# Patient Record
Sex: Male | Born: 1950 | Race: White | Hispanic: No | Marital: Married | Smoking: Never smoker
Health system: Southern US, Community
[De-identification: ages and names within clinical notes are randomized; demographics above are authoritative.]

## PROBLEM LIST (undated history)

## (undated) DIAGNOSIS — F32A Depression, unspecified: Secondary | ICD-10-CM

## (undated) DIAGNOSIS — F419 Anxiety disorder, unspecified: Secondary | ICD-10-CM

## (undated) DIAGNOSIS — E119 Type 2 diabetes mellitus without complications: Secondary | ICD-10-CM

## (undated) DIAGNOSIS — I10 Essential (primary) hypertension: Secondary | ICD-10-CM

## (undated) DIAGNOSIS — R911 Solitary pulmonary nodule: Secondary | ICD-10-CM

## (undated) DIAGNOSIS — G4730 Sleep apnea, unspecified: Secondary | ICD-10-CM

## (undated) DIAGNOSIS — E785 Hyperlipidemia, unspecified: Secondary | ICD-10-CM

---

## 2005-03-20 ENCOUNTER — Other Ambulatory Visit: Payer: Self-pay

## 2005-03-20 ENCOUNTER — Emergency Department: Payer: Self-pay | Admitting: General Practice

## 2005-03-25 ENCOUNTER — Ambulatory Visit: Payer: Self-pay | Admitting: General Practice

## 2005-08-26 ENCOUNTER — Ambulatory Visit: Payer: Self-pay | Admitting: Unknown Physician Specialty

## 2006-08-30 ENCOUNTER — Ambulatory Visit: Payer: Self-pay | Admitting: Specialist

## 2010-03-12 ENCOUNTER — Ambulatory Visit: Payer: Self-pay | Admitting: Internal Medicine

## 2010-04-08 ENCOUNTER — Ambulatory Visit: Payer: Self-pay | Admitting: Family Medicine

## 2011-03-27 ENCOUNTER — Emergency Department: Payer: Self-pay | Admitting: Emergency Medicine

## 2011-04-28 ENCOUNTER — Ambulatory Visit: Payer: Self-pay | Admitting: Family Medicine

## 2011-05-13 ENCOUNTER — Ambulatory Visit: Payer: Self-pay | Admitting: Family Medicine

## 2013-10-30 ENCOUNTER — Ambulatory Visit: Payer: Self-pay | Admitting: Family Medicine

## 2014-04-20 ENCOUNTER — Emergency Department: Payer: Self-pay | Admitting: Emergency Medicine

## 2014-05-16 ENCOUNTER — Ambulatory Visit: Payer: Self-pay | Admitting: Family Medicine

## 2019-08-03 ENCOUNTER — Other Ambulatory Visit: Payer: Self-pay

## 2019-08-03 DIAGNOSIS — Z20822 Contact with and (suspected) exposure to covid-19: Secondary | ICD-10-CM

## 2019-08-05 LAB — NOVEL CORONAVIRUS, NAA: SARS-CoV-2, NAA: NOT DETECTED

## 2020-07-05 ENCOUNTER — Other Ambulatory Visit: Payer: Self-pay | Admitting: Physician Assistant

## 2020-07-05 DIAGNOSIS — H9313 Tinnitus, bilateral: Secondary | ICD-10-CM

## 2020-07-09 ENCOUNTER — Other Ambulatory Visit: Payer: Self-pay

## 2020-07-09 ENCOUNTER — Ambulatory Visit
Admission: RE | Admit: 2020-07-09 | Discharge: 2020-07-09 | Disposition: A | Discharge status: Home / Self Care | Payer: Medicare HMO | Source: Ambulatory Visit | Attending: Physician Assistant | Admitting: Physician Assistant

## 2020-07-09 DIAGNOSIS — H9313 Tinnitus, bilateral: Secondary | ICD-10-CM | POA: Diagnosis not present

## 2020-07-09 MED ORDER — GADOBUTROL 1 MMOL/ML IV SOLN
7.0000 mL | Freq: Once | INTRAVENOUS | Status: AC | PRN
  Administered 2020-07-09: 7 mL via INTRAVENOUS

## 2020-11-15 ENCOUNTER — Other Ambulatory Visit: Payer: Medicare HMO

## 2021-01-10 ENCOUNTER — Other Ambulatory Visit
Admission: RE | Admit: 2021-01-10 | Discharge: 2021-01-10 | Disposition: A | Discharge status: Home / Self Care | Payer: Medicare HMO | Source: Ambulatory Visit | Attending: Gastroenterology | Admitting: Gastroenterology

## 2021-01-10 ENCOUNTER — Other Ambulatory Visit: Payer: Self-pay

## 2021-01-10 DIAGNOSIS — Z20822 Contact with and (suspected) exposure to covid-19: Secondary | ICD-10-CM | POA: Diagnosis not present

## 2021-01-10 DIAGNOSIS — Z01812 Encounter for preprocedural laboratory examination: Secondary | ICD-10-CM | POA: Diagnosis present

## 2021-01-10 LAB — SARS CORONAVIRUS 2 (TAT 6-24 HRS): SARS Coronavirus 2: NEGATIVE

## 2021-01-13 ENCOUNTER — Encounter: Payer: Self-pay | Admitting: *Deleted

## 2021-01-14 ENCOUNTER — Other Ambulatory Visit: Payer: Self-pay

## 2021-01-14 ENCOUNTER — Ambulatory Visit: Payer: Medicare HMO | Admitting: Anesthesiology

## 2021-01-14 ENCOUNTER — Ambulatory Visit
Admission: RE | Admit: 2021-01-14 | Discharge: 2021-01-14 | Disposition: A | Discharge status: Home / Self Care | Payer: Medicare HMO | Attending: Gastroenterology | Admitting: Gastroenterology

## 2021-01-14 ENCOUNTER — Encounter: Payer: Self-pay | Admitting: *Deleted

## 2021-01-14 ENCOUNTER — Encounter
Admission: RE | Disposition: A | Discharge status: Home / Self Care | Payer: Self-pay | Source: Home / Self Care | Attending: Gastroenterology

## 2021-01-14 DIAGNOSIS — Z1211 Encounter for screening for malignant neoplasm of colon: Secondary | ICD-10-CM | POA: Insufficient documentation

## 2021-01-14 DIAGNOSIS — D123 Benign neoplasm of transverse colon: Secondary | ICD-10-CM | POA: Insufficient documentation

## 2021-01-14 DIAGNOSIS — Z79899 Other long term (current) drug therapy: Secondary | ICD-10-CM | POA: Diagnosis not present

## 2021-01-14 DIAGNOSIS — K5730 Diverticulosis of large intestine without perforation or abscess without bleeding: Secondary | ICD-10-CM | POA: Diagnosis not present

## 2021-01-14 DIAGNOSIS — K621 Rectal polyp: Secondary | ICD-10-CM | POA: Diagnosis not present

## 2021-01-14 DIAGNOSIS — I10 Essential (primary) hypertension: Secondary | ICD-10-CM | POA: Diagnosis not present

## 2021-01-14 HISTORY — DX: Solitary pulmonary nodule: R91.1

## 2021-01-14 HISTORY — DX: Hyperlipidemia, unspecified: E78.5

## 2021-01-14 HISTORY — DX: Type 2 diabetes mellitus without complications: E11.9

## 2021-01-14 HISTORY — DX: Sleep apnea, unspecified: G47.30

## 2021-01-14 HISTORY — DX: Depression, unspecified: F32.A

## 2021-01-14 HISTORY — PX: COLONOSCOPY: SHX5424

## 2021-01-14 HISTORY — DX: Anxiety disorder, unspecified: F41.9

## 2021-01-14 HISTORY — DX: Essential (primary) hypertension: I10

## 2021-01-14 SURGERY — COLONOSCOPY
Anesthesia: General

## 2021-01-14 MED ORDER — SODIUM CHLORIDE 0.9 % IV SOLN: INTRAVENOUS | Status: DC

## 2021-01-14 MED ORDER — PROPOFOL 10 MG/ML IV BOLUS
INTRAVENOUS | Status: DC | PRN
  Administered 2021-01-14 (×2): 20 mg via INTRAVENOUS
  Administered 2021-01-14 (×2): 50 mg via INTRAVENOUS
  Administered 2021-01-14 (×2): 20 mg via INTRAVENOUS

## 2021-01-14 MED ORDER — PROPOFOL 500 MG/50ML IV EMUL
INTRAVENOUS | Status: AC
  Filled 2021-01-14: qty 50

## 2021-01-14 NOTE — Anesthesia Preprocedure Evaluation (Signed)
Anesthesia Evaluation  Patient identified by MRN, date of birth, ID band Patient awake    Reviewed: Allergy & Precautions, NPO status , Patient's Chart, lab work & pertinent test results  History of Anesthesia Complications Negative for: history of anesthetic complications  Airway Mallampati: II  TM Distance: >3 FB Neck ROM: Full    Dental no notable dental hx.    Pulmonary sleep apnea and Continuous Positive Airway Pressure Ventilation , neg COPD,    breath sounds clear to auscultation- rhonchi (-) wheezing      Cardiovascular Exercise Tolerance: Good hypertension, Pt. on medications (-) CAD, (-) Past MI, (-) Cardiac Stents and (-) CABG  Rhythm:Regular Rate:Normal - Systolic murmurs and - Diastolic murmurs    Neuro/Psych neg Seizures PSYCHIATRIC DISORDERS Anxiety Depression negative neurological ROS     GI/Hepatic negative GI ROS, Neg liver ROS,   Endo/Other  negative endocrine ROSneg diabetes  Renal/GU negative Renal ROS     Musculoskeletal negative musculoskeletal ROS (+)   Abdominal (+) - obese,   Peds  Hematology negative hematology ROS (+)   Anesthesia Other Findings Past Medical History: No date: Anxiety No date: Depression No date: Hyperlipidemia No date: Hypertension No date: Sleep apnea No date: Solitary pulmonary nodule   Reproductive/Obstetrics                             Anesthesia Physical Anesthesia Plan  ASA: II  Anesthesia Plan: General   Post-op Pain Management:    Induction: Intravenous  PONV Risk Score and Plan: 1 and Propofol infusion  Airway Management Planned: Natural Airway  Additional Equipment:   Intra-op Plan:   Post-operative Plan:   Informed Consent: I have reviewed the patients History and Physical, chart, labs and discussed the procedure including the risks, benefits and alternatives for the proposed anesthesia with the patient or  authorized representative who has indicated his/her understanding and acceptance.     Dental advisory given  Plan Discussed with: CRNA and Anesthesiologist  Anesthesia Plan Comments:         Anesthesia Quick Evaluation

## 2021-01-14 NOTE — Transfer of Care (Signed)
Immediate Anesthesia Transfer of Care Note  Patient: Victor Johns  Procedure(s) Performed: COLONOSCOPY (N/A )  Patient Location: PACU and Endoscopy Unit  Anesthesia Type:General  Level of Consciousness: awake, alert  and oriented  Airway & Oxygen Therapy: Patient Spontanous Breathing  Post-op Assessment: Report given to RN and Post -op Vital signs reviewed and stable  Post vital signs: Reviewed and stable  Last Vitals:  Vitals Value Taken Time  BP    Temp    Pulse    Resp    SpO2      Last Pain:  Vitals:   01/14/21 0725  TempSrc: Temporal  PainSc: 0-No pain         Complications: No complications documented.

## 2021-01-14 NOTE — Interval H&P Note (Signed)
History and Physical Interval Note:  01/14/2021 8:26 AM  Victor Johns  has presented today for surgery, with the diagnosis of PERSONAL HX.OF COLON POLYPS.  The various methods of treatment have been discussed with the patient and family. After consideration of risks, benefits and other options for treatment, the patient has consented to  Procedure(s) with comments: COLONOSCOPY (N/A) - DM as a surgical intervention.  The patient's history has been reviewed, patient examined, no change in status, stable for surgery.  I have reviewed the patient's chart and labs.  Questions were answered to the patient's satisfaction.     Lesly Rubenstein  Ok to proceed with colonoscopy

## 2021-01-14 NOTE — H&P (Signed)
Outpatient short stay form Pre-procedure 01/14/2021 8:24 AM Raylene Miyamoto MD, MPH  Primary Physician: Dr. Ellison Hughs  Reason for visit:  Surveillance Colonoscopy  History of present illness:   70 y/o gentleman with history of hypertension here for surveillance colonoscopy for history of polyps. No family history of GI malignancies. No abdominal surgeries. No blood thinners.    Current Facility-Administered Medications:  .  0.9 %  sodium chloride infusion, , Intravenous, Continuous, Glady Ouderkirk, Hilton Cork, MD, Last Rate: 20 mL/hr at 01/14/21 0744, New Bag at 01/14/21 0744  Medications Prior to Admission  Medication Sig Dispense Refill Last Dose  . FLUoxetine (PROZAC) 10 MG capsule Take 10 mg by mouth daily.   01/14/2021 at Unknown time  . lisinopril (ZESTRIL) 20 MG tablet Take 20 mg by mouth daily.   01/14/2021 at 0600     No Known Allergies   Past Medical History:  Diagnosis Date  . Anxiety   . Depression   . Hyperlipidemia   . Hypertension   . Sleep apnea   . Solitary pulmonary nodule     Review of systems:  Otherwise negative.    Physical Exam  Gen: Alert, oriented. Appears stated age.  HEENT: PERRLA. Lungs: No respiratory distress CV: RRR Abd: soft, benign, no masses Ext: No edema    Planned procedures: Proceed with colonoscopy. The patient understands the nature of the planned procedure, indications, risks, alternatives and potential complications including but not limited to bleeding, infection, perforation, damage to internal organs and possible oversedation/side effects from anesthesia. The patient agrees and gives consent to proceed.  Please refer to procedure notes for findings, recommendations and patient disposition/instructions.     Raylene Miyamoto MD, MPH Gastroenterology 01/14/2021  8:24 AM

## 2021-01-14 NOTE — Anesthesia Postprocedure Evaluation (Signed)
Anesthesia Post Note  Patient: Victor Johns  Procedure(s) Performed: COLONOSCOPY (N/A )  Patient location during evaluation: Endoscopy Anesthesia Type: General Level of consciousness: awake and alert and oriented Pain management: pain level controlled Vital Signs Assessment: post-procedure vital signs reviewed and stable Respiratory status: spontaneous breathing, nonlabored ventilation and respiratory function stable Cardiovascular status: blood pressure returned to baseline and stable Postop Assessment: no signs of nausea or vomiting Anesthetic complications: no   No complications documented.   Last Vitals:  Vitals:   01/14/21 0910 01/14/21 0920  BP: 119/88 122/65  Pulse: 63 (!) 57  Resp: 15 12  Temp:    SpO2: 100% 100%    Last Pain:  Vitals:   01/14/21 0725  TempSrc: Temporal  PainSc: 0-No pain                 Niurka Benecke

## 2021-01-14 NOTE — Op Note (Signed)
[facility] Gastroenterology Patient Name: Victor Johns Procedure Date: 01/14/2021 8:23 AM MRN: [id number] Account #: [id number] Date of Birth: 12-28-1950 Admit Type: Outpatient Age: 70 Room: [facility] ENDO ROOM 1 Gender: Male Note Status: Finalized Procedure:             Colonoscopy Indications:           High risk colon cancer surveillance: Personal history                         of non-advanced adenoma Providers:             Andrey Farmer MD, MD Referring MD:          Sofie Hartigan (Referring MD) Medicines:             Monitored Anesthesia Care Complications:         No immediate complications. Estimated blood loss:                         Minimal. Procedure:             Pre-Anesthesia Assessment:                        - Prior to the procedure, a History and Physical was                         performed, and patient medications and allergies were                         reviewed. The patient is competent. The risks and                         benefits of the procedure and the sedation options and                         risks were discussed with the patient. All questions                         were answered and informed consent was obtained.                         Patient identification and proposed procedure were                         verified by the physician, the nurse, the anesthetist                         and the technician in the endoscopy suite. Mental                         Status Examination: alert and oriented. Airway                         Examination: normal oropharyngeal airway and neck                         mobility. Respiratory Examination: clear to  auscultation. CV Examination: normal. Prophylactic                         Antibiotics: The patient does not require prophylactic                         antibiotics. Prior Anticoagulants: The patient has                         taken no previous anticoagulant  or antiplatelet                         agents. ASA Grade Assessment: II - A patient with mild                         systemic disease. After reviewing the risks and                         benefits, the patient was deemed in satisfactory                         condition to undergo the procedure. The anesthesia                         plan was to use monitored anesthesia care (MAC).                         Immediately prior to administration of medications,                         the patient was re-assessed for adequacy to receive                         sedatives. The heart rate, respiratory rate, oxygen                         saturations, blood pressure, adequacy of pulmonary                         ventilation, and response to care were monitored                         throughout the procedure. The physical status of the                         patient was re-assessed after the procedure.                        After obtaining informed consent, the colonoscope was                         passed under direct vision. Throughout the procedure,                         the patient's blood pressure, pulse, and oxygen                         saturations were monitored continuously. The  Colonoscope was introduced through the anus and                         advanced to the the cecum, identified by appendiceal                         orifice and ileocecal valve. The colonoscopy was                         performed without difficulty. The patient tolerated                         the procedure well. The quality of the bowel                         preparation was good. Findings:      The perianal and digital rectal examinations were normal.      Two sessile polyps were found in the transverse colon. The polyps were 3       to 4 mm in size. These polyps were removed with a cold snare. Resection       and retrieval were complete. Estimated blood loss was minimal.       A 5 mm polyp was found in the rectum. The polyp was semi-pedunculated.       The polyp was removed with a cold snare. Resection and retrieval were       complete. Estimated blood loss was minimal.      A few small-mouthed diverticula were found in the sigmoid colon.      The exam was otherwise without abnormality on direct and retroflexion       views. Impression:            - Two 3 to 4 mm polyps in the transverse colon,                         removed with a cold snare. Resected and retrieved.                        - One 5 mm polyp in the rectum, removed with a cold                         snare. Resected and retrieved.                        - Diverticulosis in the sigmoid colon.                        - The examination was otherwise normal on direct and                         retroflexion views. Recommendation:        - Discharge patient to home.                        - Resume previous diet.                        - Continue present medications.                        -  Await pathology results.                        - Repeat colonoscopy for surveillance based on                         pathology results.                        - Return to referring physician as previously                         scheduled. Procedure Code(s):     --- Professional ---                        [phone], Colonoscopy, flexible; with removal of                         tumor(s), polyp(s), or other lesion(s) by snare                         technique Diagnosis Code(s):     --- Professional ---                        Z86.010, Personal history of colonic polyps                        K63.5, Polyp of colon                        K62.1, Rectal polyp                        K57.30, Diverticulosis of large intestine without                         perforation or abscess without bleeding CPT copyright 2019 [employer]. All rights reserved. The codes documented in this report are preliminary and  upon coder review may  be revised to meet current compliance requirements. Andrey Farmer MD, MD 01/14/2021 8:49:16 AM Number of Addenda: 0 Note Initiated On: 01/14/2021 8:23 AM Scope Withdrawal Time: 0 hours 9 minutes 5 seconds  Total Procedure Duration: 0 hours 12 minutes 21 seconds  Estimated Blood Loss:  Estimated blood loss was minimal.      [facility]

## 2021-01-15 LAB — SURGICAL PATHOLOGY

## 2022-05-14 IMAGING — MR MR BRAIN/IAC WO/W CM
10 of 14 series · 27 of 48 positions shown · IV contrast (7ml Gadavist)
Comparison: Head CT March 20, 2005

CLINICAL DATA: Tinnitus of both ears.

EXAM:
MRI HEAD WITHOUT AND WITH CONTRAST
TECHNIQUE: Multiplanar, multiecho pulse sequences of the brain and surrounding
structures were obtained without and with intravenous contrast.
CONTRAST:  7mL GADAVIST GADOBUTROL 1 MMOL/ML IV SOLN

[Series 5: T1 · sagittal · 5.0mm · 0.62mm/px · 1 of 23 slices shown (1 of 3)]
[im 1/23]
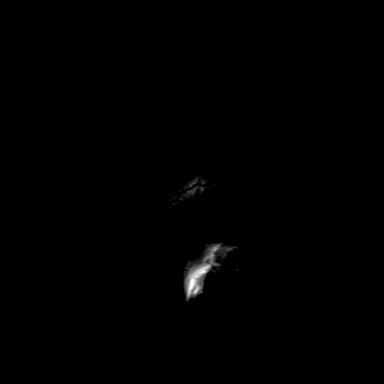

[Series 6: ax dwi_tracew · axial · 3.0mm · 0.60mm/px · z∈[-81,+71]mm · 6 of 96 slices shown]
[im 1/96]
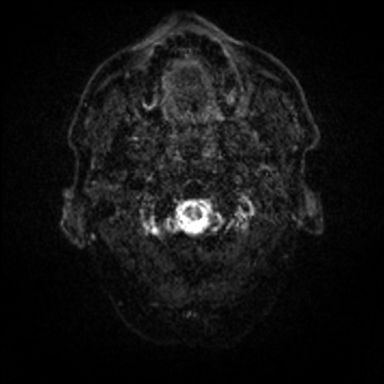
[im 20/96]
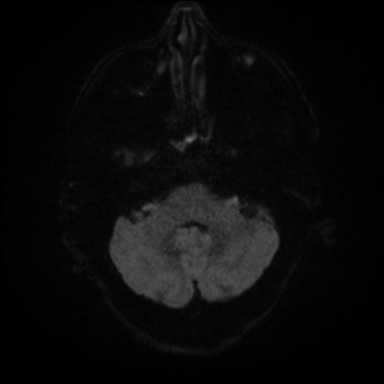
[im 39/96]
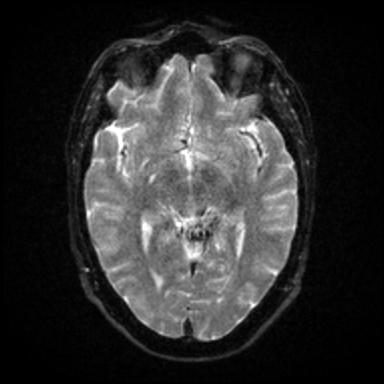
[im 58/96]
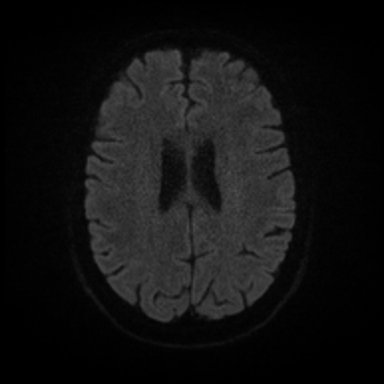
[im 77/96]
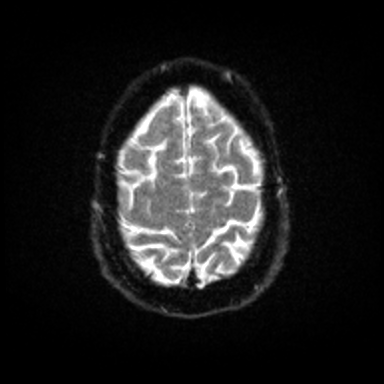
[im 96/96]
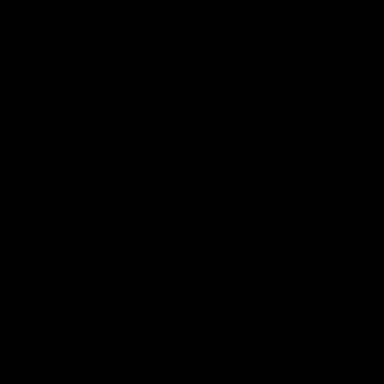

[Series 7: ax dwi_adc · axial · 3.0mm · 0.60mm/px · 1 of 47 slices shown]
[im 1/47]
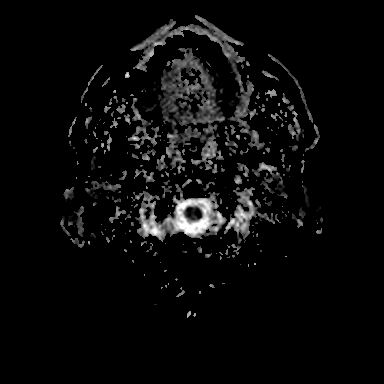

[Series 8: T2 · axial · 5.0mm · 0.53mm/px · z∈[-76,+65]mm · 2 of 25 slices shown]
[im 1/25]
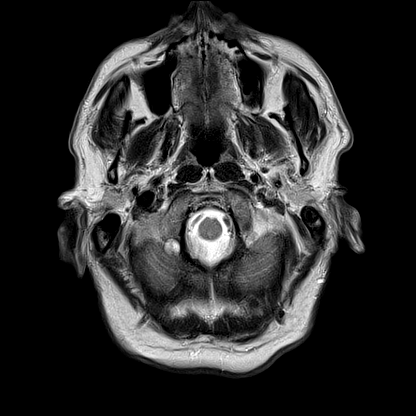
[im 25/25]
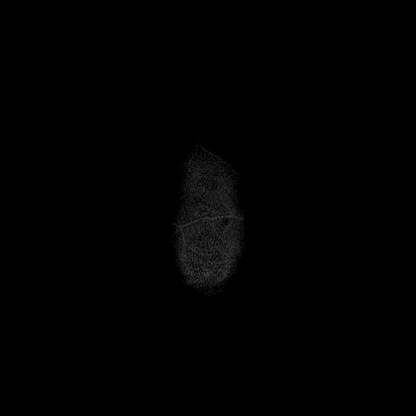

[Series 13: FLAIR · axial · 3.0mm · 0.53mm/px · z∈[-85,+74]mm · 4 of 55 slices shown]
[im 1/55]
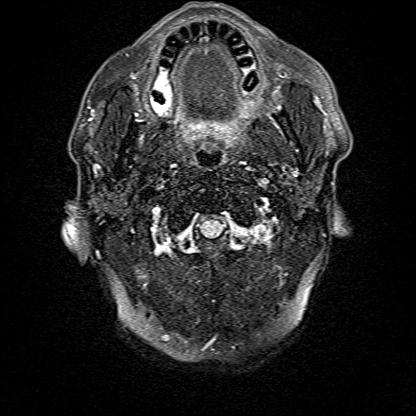
[im 19/55]
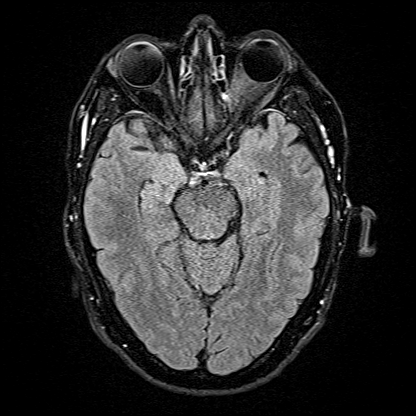
[im 37/55]
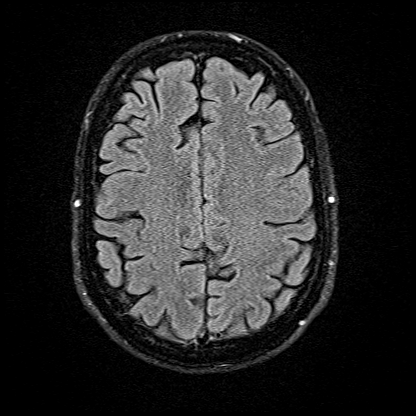
[im 55/55]
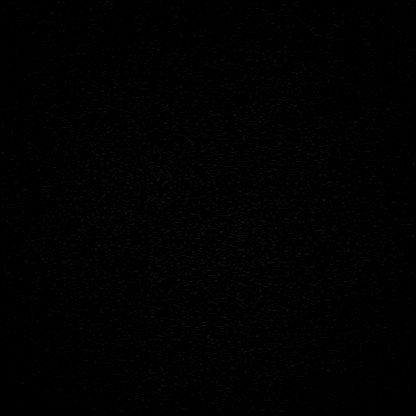

[Series 14: T1 · coronal · non-contrast · 3.0mm · 0.21mm/px · 1 of 13 slices shown (2 of 3)]
[im 1/13]
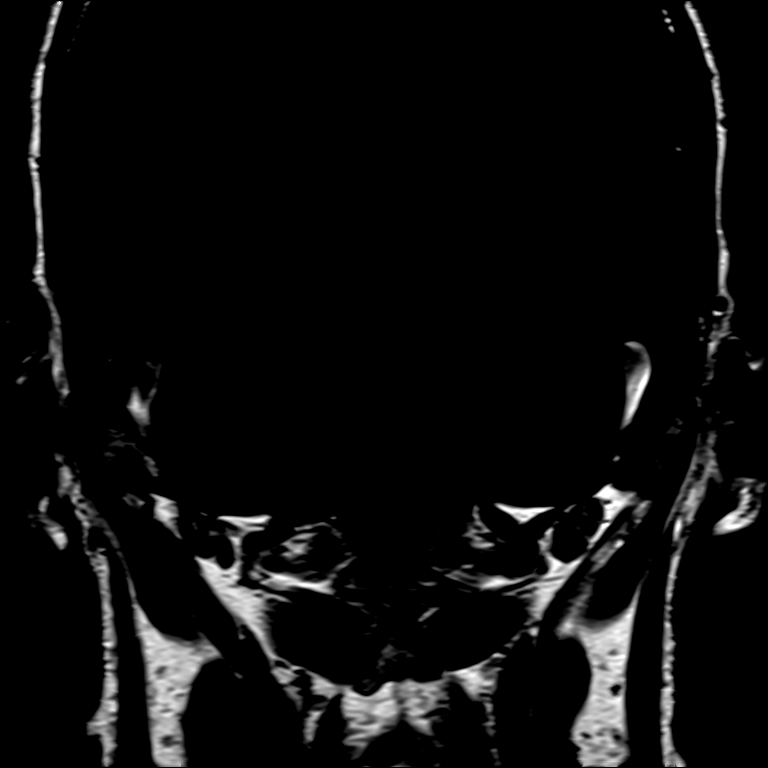

[Series 16: T1 · axial · non-contrast · 3.0mm · 0.21mm/px · 1 of 15 slices shown (3 of 3)]
[im 1/15]
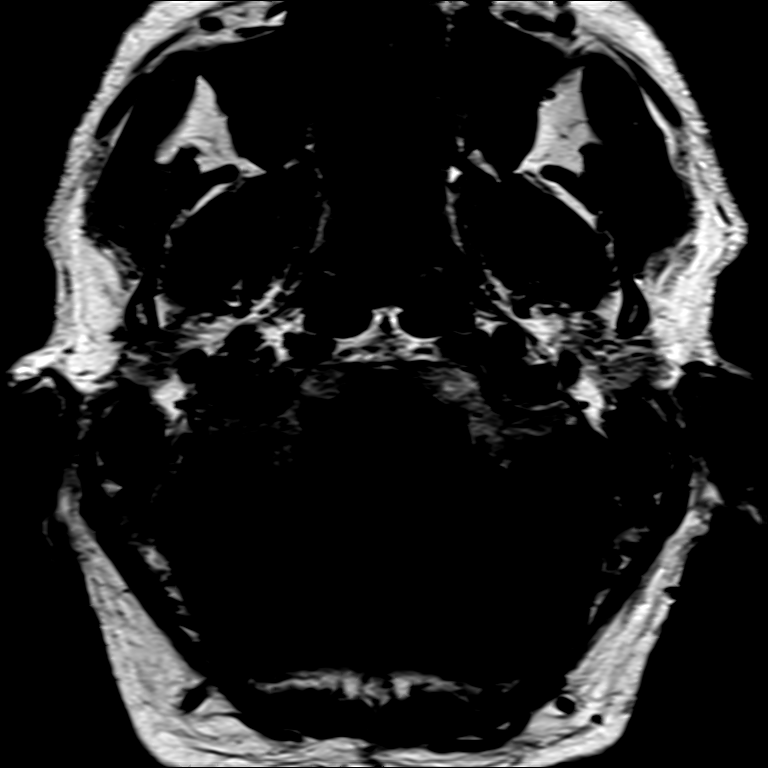

[Series 17: T1 post-contrast · axial · 3.0mm · 0.21mm/px · 1 of 15 slices shown (1 of 3)]
[im 1/15]
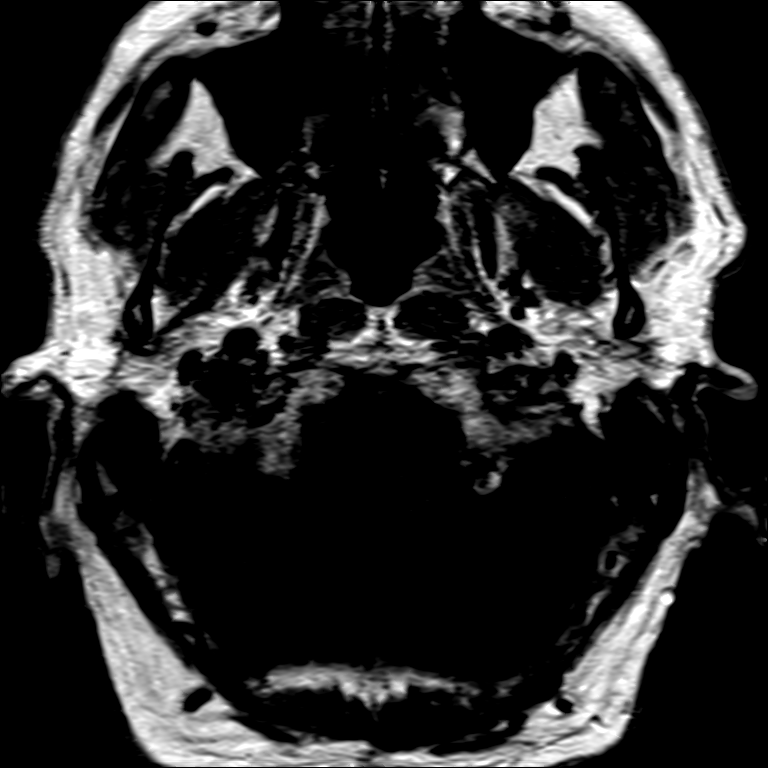

[Series 18: T1 post-contrast · coronal · 3.0mm · 0.21mm/px · 1 of 13 slices shown (2 of 3)]
[im 1/13]
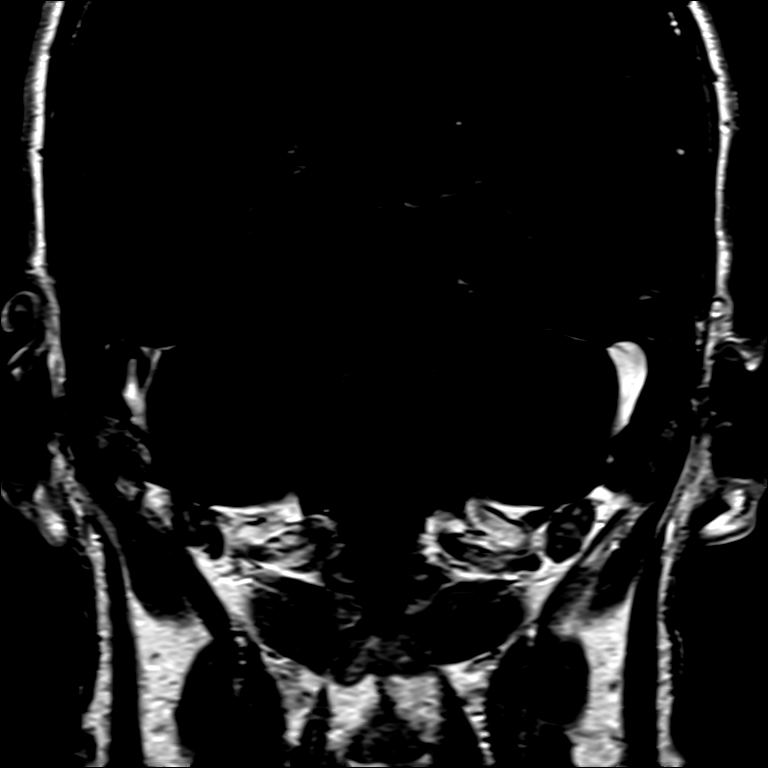

[Series 19: T1 post-contrast · axial · 1.0mm · 0.98mm/px · z∈[-89,+83]mm · 9 of 176 slices shown (3 of 3)]
[im 1/176]
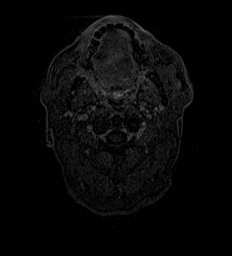
[im 32/176]
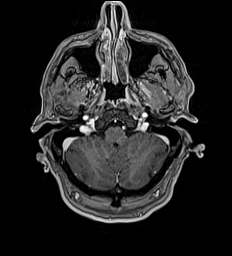
[im 48/176]
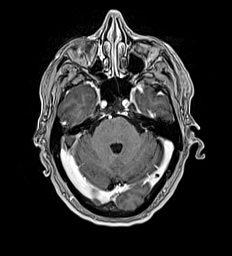
[im 80/176]
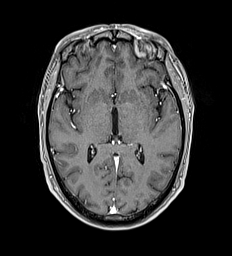
[im 96/176]
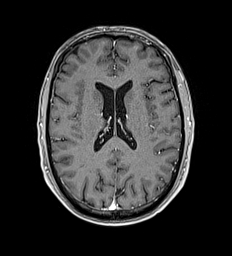
[im 128/176]
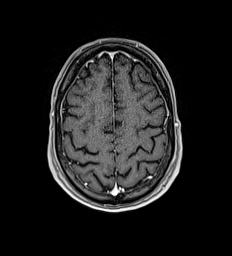
[im 144/176]
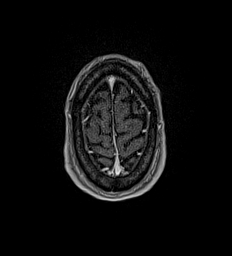
[im 160/176]
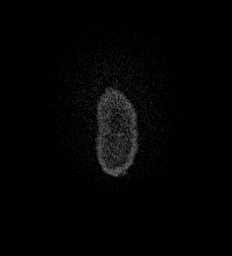
[im 176/176]
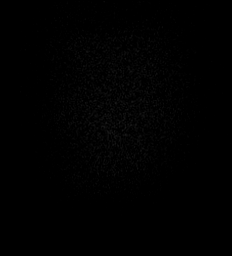

[27 of 48 positions shown; findings below may reference images not displayed]

FINDINGS: Brain: No acute infarction, hemorrhage, hydrocephalus, extra-axial
collection or mass lesion. The brain parenchyma has normal
morphology and signal characteristics.

No cerebellopontine angle mass or internal auditory canal lesion is
demonstrated.Normal appearance of the 7th and 8th cranial nerves
bilaterally. No focus of abnormal contrast enhancement.

Vascular: Normal flow voids.

Skull and upper cervical spine: Normal marrow signal.

Sinuses/Orbits: Trace mucosal thickening of the ethmoid cells. The
orbits are maintained.

Other: None.
IMPRESSION: Unremarkable MRI of the brain. Specifically, no cerebellopontine
angle mass or internal auditory canal lesion. Normal appearance of
the 7th and 8th cranial nerves bilaterally.

## 2023-03-01 ENCOUNTER — Encounter: Payer: Self-pay | Admitting: Emergency Medicine

## 2023-03-01 ENCOUNTER — Ambulatory Visit
Admission: EM | Admit: 2023-03-01 | Discharge: 2023-03-01 | Disposition: A | Discharge status: Home / Self Care | Payer: Medicare HMO

## 2023-03-01 DIAGNOSIS — S61432A Puncture wound without foreign body of left hand, initial encounter: Secondary | ICD-10-CM | POA: Diagnosis not present

## 2023-03-01 MED ORDER — LEVOFLOXACIN 500 MG PO TABS: 500.0000 mg | ORAL_TABLET | Freq: Every day | ORAL | 0 refills | Status: AC

## 2023-03-01 NOTE — ED Provider Notes (Signed)
[facility]    CSN: [id number] Arrival date & time: 03/01/23  1307      History   Chief Complaint Chief Complaint  Patient presents with   Puncture Wound    HPI Victor Johns is a 72 y.o. male presenting for puncture of the left hand due to catfish sting yesterday.  He reports the barb went in and came back out immediately.  He denies pulling it out.  States the fish flopped and it came out.  He says he immediately felt nauseous and started having spasms in his hand.  Reports a lot of swelling and discomfort yesterday.  The swelling is gone down a little today but it is still swollen and he has a bruise on his palm.  He reports that he has flushed and clean the area well.  He says his last tetanus is unknown.  Per chart review last tetanus immunization was January 2015.   HPI  Past Medical History:  Diagnosis Date   Anxiety    Depression    Hyperlipidemia    Hypertension    Sleep apnea    Solitary pulmonary nodule     There are no problems to display for this patient.   Past Surgical History:  Procedure Laterality Date   COLONOSCOPY N/A 01/14/2021   Procedure: COLONOSCOPY;  Surgeon: Regis Bill, MD;  Location: [facility] ENDOSCOPY;  Service: Endoscopy;  Laterality: N/A;  DM       Home Medications    Prior to Admission medications   Medication Sig Start Date End Date Taking? Authorizing Provider  levofloxacin (LEVAQUIN) 500 MG tablet Take 1 tablet (500 mg total) by mouth daily for 7 days. 03/01/23 03/08/23 Yes Eusebio Friendly B, PA-C  tadalafil (CIALIS) 10 MG tablet Take by mouth. 01/15/22  Yes [provider]  FLUoxetine (PROZAC) 10 MG capsule Take 10 mg by mouth daily.    [provider]  lisinopril (ZESTRIL) 20 MG tablet Take 20 mg by mouth daily.    [provider]    Family History History reviewed. No pertinent family history.  Social History Social History   Tobacco Use   Smoking status: Never   Smokeless  tobacco: Never  Vaping Use   Vaping Use: Never used  Substance Use Topics   Alcohol use: Yes    Comment: occasional   Drug use: Never     Allergies   Patient has no known allergies.   Review of Systems Review of Systems  Constitutional:  Negative for fatigue and fever.  Musculoskeletal:  Positive for arthralgias and joint swelling.  Skin:  Positive for color change and wound.  Neurological:  Negative for weakness.     Physical Exam Triage Vital Signs ED Triage Vitals  Enc Vitals Group     BP 03/01/23 1508 (!) 146/73     Pulse Rate 03/01/23 1508 72     Resp 03/01/23 1508 16     Temp 03/01/23 1508 98.1 F (36.7 C)     Temp Source 03/01/23 1508 Oral     SpO2 03/01/23 1508 100 %     Weight --      Height --      Head Circumference --      Peak Flow --      Pain Score 03/01/23 1505 0     Pain Loc --      Pain Edu? --      Excl. in GC? --    No data found.  Updated Vital Signs BP (!) 146/73 (BP Location: Left Arm)   Pulse 72   Temp 98.1 F (36.7 C) (Oral)   Resp 16   SpO2 100%    Physical Exam Vitals and nursing note reviewed.  Constitutional:      General: He is not in acute distress.    Appearance: Normal appearance. He is well-developed. He is not ill-appearing.  HENT:     Head: Normocephalic and atraumatic.  Eyes:     General: No scleral icterus.    Conjunctiva/sclera: Conjunctivae normal.  Cardiovascular:     Rate and Rhythm: Normal rate and regular rhythm.     Pulses: Normal pulses.  Pulmonary:     Effort: Pulmonary effort is normal. No respiratory distress.  Musculoskeletal:     Cervical back: Neck supple.  Skin:    General: Skin is warm and dry.     Capillary Refill: Capillary refill takes less than 2 seconds.     Comments: LEFT HAND: There is a puncture wound of the web between the thumb and index finger. Mild swelling dorsal hand and palm with ecchymosis on palm. Good pulses and strength  Neurological:     General: No focal deficit  present.     Mental Status: He is alert. Mental status is at baseline.     Motor: No weakness.     Gait: Gait normal.  Psychiatric:        Mood and Affect: Mood normal.        Behavior: Behavior normal.      UC Treatments / Results  Labs (all labs ordered are listed, but only abnormal results are displayed) Labs Reviewed - No data to display  EKG   Radiology No results found.  Procedures Procedures (including critical care time)  Medications Ordered in UC Medications - No data to display  Initial Impression / Assessment and Plan / UC Course  I have reviewed the triage vital signs and the nursing notes.  Pertinent labs & imaging results that were available during my care of the patient were reviewed by me and considered in my medical decision making (see chart for details).   72 year old male presents for puncture wound of the left hand after a catfish barb punctured his hand.  Reports a lot of bleeding initially with spasming of his hand, nausea and sweats.  Swelling has gone down a little.  Pain has improved slightly.  He reports also taking Benadryl for any potential allergic reaction.  On exam today he has mild to moderate swelling of the hand without any erythema.  Mild ecchymosis and obvious puncture without any bleeding.  Area is a little tender around the puncture site.  Tetanus last given January 2015.  Discussed wound care guidelines with patient.  Sent Levaquin to prophylactically treat for any infection which could be caused by any aquatic organisms.  Reviewed return and ER precautions.   Final Clinical Impressions(s) / UC Diagnoses   Final diagnoses:  Puncture wound of left hand without foreign body, initial encounter     Discharge Instructions      -Keep wound clean with soap and water. Apply small amount of neosporin. -Antibiotics to treat infection with noticed increase swelling, redness, pustular drainage, please return. - Ice and elevate the hand.   Ibuprofen and/or Tylenol for pain relief.     ED Prescriptions     Medication Sig Dispense Auth. Provider   levofloxacin (LEVAQUIN) 500 MG tablet Take 1 tablet (500 mg total) by mouth daily  for 7 days. 7 tablet Gareth Morgan      PDMP not reviewed this encounter.   Shirlee Latch, PA-C 03/01/23 [phone]

## 2023-03-01 NOTE — ED Triage Notes (Signed)
Pt went fishing yesterday and the barb from a catfish punctured his left hand. He has some swelling to the area. Last tetanus unknown.

## 2023-03-01 NOTE — Discharge Instructions (Addendum)
-  Keep wound clean with soap and water. Apply small amount of neosporin. -Antibiotics to treat infection with noticed increase swelling, redness, pustular drainage, please return. - Ice and elevate the hand.  Ibuprofen and/or Tylenol for pain relief.
